# Patient Record
Sex: Male | Born: 1980 | Race: White | Hispanic: No | Marital: Single | Smoking: Current every day smoker
Health system: Southern US, Community
[De-identification: ages and names within clinical notes are randomized; demographics above are authoritative.]

## PROBLEM LIST (undated history)

## (undated) HISTORY — PX: SKIN GRAFT: SHX250

---

## 2006-02-17 ENCOUNTER — Emergency Department (HOSPITAL_COMMUNITY): Admission: EM | Admit: 2006-02-17 | Discharge: 2006-02-17 | Payer: Self-pay | Admitting: Emergency Medicine

## 2007-12-30 IMAGING — CR DG FINGER THUMB 2+V*R*
1 series · 1 of 1 positions shown · non-contrast
Comparison: None.

CLINICAL DATA: Caught right thumb between metal hook and beam.
 RIGHT THUMB ? 3 VIEWS ? 02/17/06:

[view not recorded]
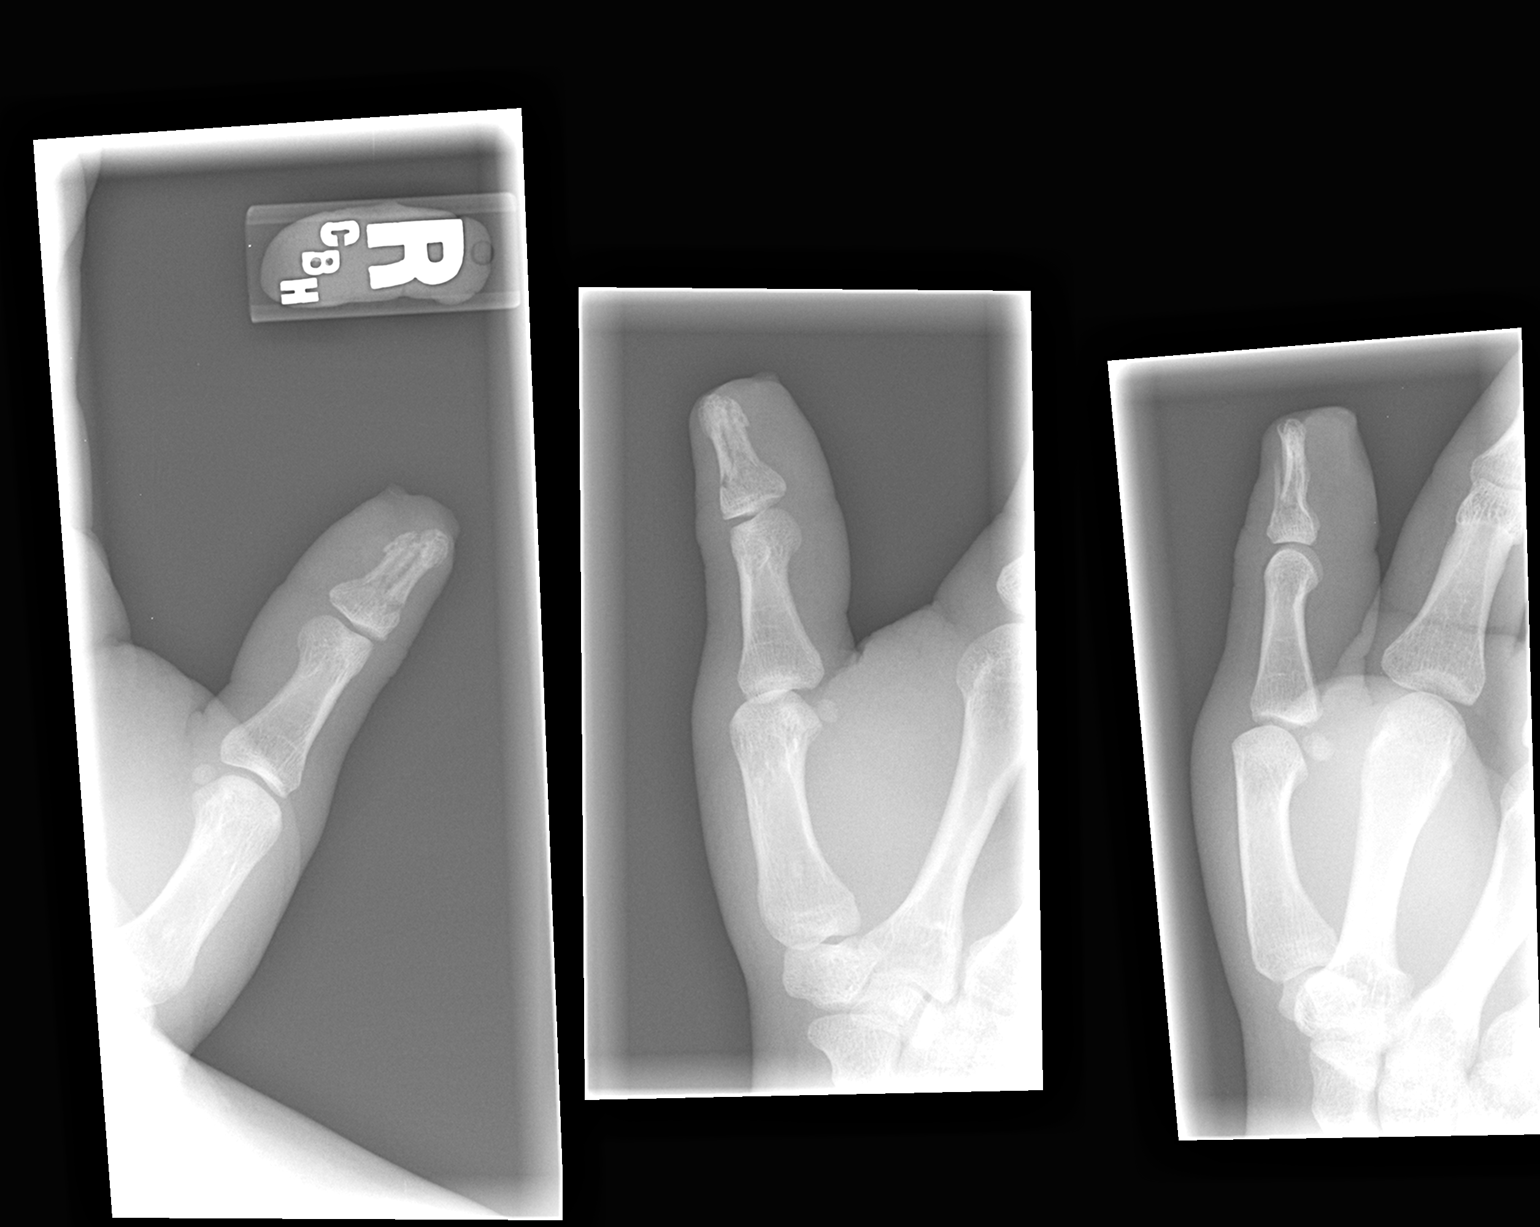

[1 of 1 positions shown; findings below may reference images not displayed]

FINDINGS: There is a mildly comminuted and displaced fracture of the distal phalangeal shaft and tuft with overlying soft tissue injury.
IMPRESSION: Distal phalangeal fracture and soft tissue injury, as above.

## 2016-02-29 ENCOUNTER — Ambulatory Visit (INDEPENDENT_AMBULATORY_CARE_PROVIDER_SITE_OTHER): Payer: Self-pay | Admitting: Orthopaedic Surgery

## 2016-03-07 ENCOUNTER — Ambulatory Visit (INDEPENDENT_AMBULATORY_CARE_PROVIDER_SITE_OTHER): Payer: Self-pay | Admitting: Orthopaedic Surgery

## 2016-03-07 ENCOUNTER — Encounter (INDEPENDENT_AMBULATORY_CARE_PROVIDER_SITE_OTHER): Payer: Self-pay | Admitting: Orthopaedic Surgery

## 2016-03-07 VITALS — BP 133/75 | HR 86 | Ht 67.0 in | Wt 145.0 lb

## 2016-03-07 DIAGNOSIS — S8255XA Nondisplaced fracture of medial malleolus of left tibia, initial encounter for closed fracture: Secondary | ICD-10-CM

## 2016-03-07 DIAGNOSIS — M25572 Pain in left ankle and joints of left foot: Secondary | ICD-10-CM

## 2016-03-07 NOTE — Progress Notes (Signed)
Office Visit Note   Patient: Mathew RobertsRandy M Ward           Date of Birth: [birth year]           MRN: [id number] Visit Date: 03/07/2016              Requested by: No referring provider defined for this encounter. PCP: No primary care provider on file.   Assessment & Plan: Visit Diagnoses:  1. Pain in left ankle and joints of left foot            Left minimally displaced medial malleolar fracture without mortise shifting Plan: We discussed options with operative versus nonoperative treatment. He would like to proceed with nonoperative treatment and short-leg cast was applied nonweightbearing.  Follow-Up Instructions: Return in about 5 weeks (around 04/11/2016).   Orders:  No orders of the defined types were placed in this encounter.  No orders of the defined types were placed in this encounter.     Procedures: No procedures performed   Clinical Data: No additional findings.   Subjective: Chief Complaint  Patient presents with  . Lower Back - Pain  . Left Ankle - Fracture    Patient is here after falling off of a roof on 02/23/2016.  [facility]. He has a left ankle medial malleolus fracture. He also has an old fracture of L1 but states that he has had back pain since the fall. He states that his back also catches. He is taking Hydrocodone, and is in a splint and on crutches. He was prescribed Diclofenac but did not pick this up.     Review of Systems 14 point review of systems reviewed and is negative as it pertains to his sister present illness Objective: Vital Signs: BP 133/75   Pulse 86   Ht 5\' 7"  (1.702 m)   Wt 145 lb (65.8 kg)   BMI 22.71 kg/m   Physical Exam  Constitutional: He is oriented to person, place, and time. He appears well-developed and well-nourished.  HENT:  Head: Normocephalic and atraumatic.  Eyes: EOM are normal. Pupils are equal, round, and reactive to light.  Neck: No tracheal deviation  present. No thyromegaly present.  Cardiovascular: Normal rate.   Pulmonary/Chest: Effort normal. He has no wheezes.  Abdominal: Soft. Bowel sounds are normal.  Musculoskeletal:  Mild tenderness and swelling at the medial malleolus.  Neurological: He is alert and oriented to person, place, and time.  Skin: Skin is warm and dry. Capillary refill takes less than 2 seconds.  Psychiatric: He has a normal mood and affect. His behavior is normal. Judgment and thought content normal.    Ortho Exam pulses are intact this is a closed fracture. Short-leg cast application was performed. Sensation the foot is intact.  Specialty Comments:  No specialty comments available.  Imaging: No results found.   PMFS History: There are no active problems to display for this patient.  History reviewed. No pertinent past medical history.  Family History  Problem Relation Age of Onset  . Cancer Mother   . Heart attack Father   . Cancer Maternal Grandmother   . Heart Problems Maternal Grandfather     Past Surgical History:  Procedure Laterality Date  . SKIN GRAFT     Social History   Occupational History  . Not on file.   Social History Main Topics  . Smoking status: Current Every Day Smoker  . Smokeless tobacco: Never  Used  . Alcohol use No  . Drug use: No  . Sexual activity: Not on file

## 2016-04-11 ENCOUNTER — Ambulatory Visit (INDEPENDENT_AMBULATORY_CARE_PROVIDER_SITE_OTHER): Payer: Self-pay | Admitting: Orthopaedic Surgery
